# Patient Record
Sex: Female | Born: 1950 | Race: White | Hispanic: No | Smoking: Never smoker
Health system: Southern US, Community
[De-identification: ages and names within clinical notes are randomized; demographics above are authoritative.]

---

## 2006-03-31 ENCOUNTER — Ambulatory Visit: Payer: Self-pay

## 2009-10-22 ENCOUNTER — Inpatient Hospital Stay (HOSPITAL_COMMUNITY): Admission: RE | Admit: 2009-10-22 | Discharge: 2009-10-25 | Payer: Self-pay | Admitting: Orthopedic Surgery

## 2009-11-24 ENCOUNTER — Encounter: Payer: Self-pay | Admitting: Orthopedic Surgery

## 2009-11-28 ENCOUNTER — Encounter: Payer: Self-pay | Admitting: Orthopedic Surgery

## 2009-12-28 ENCOUNTER — Encounter: Payer: Self-pay | Admitting: Orthopedic Surgery

## 2010-08-30 ENCOUNTER — Ambulatory Visit
Admission: RE | Admit: 2010-08-30 | Discharge: 2010-08-30 | Payer: Self-pay | Source: Home / Self Care | Attending: Family Medicine | Admitting: Family Medicine

## 2010-08-30 ENCOUNTER — Encounter: Payer: Self-pay | Admitting: Family Medicine

## 2010-08-30 DIAGNOSIS — E785 Hyperlipidemia, unspecified: Secondary | ICD-10-CM | POA: Insufficient documentation

## 2010-10-01 NOTE — Assessment & Plan Note (Signed)
Summary: SORE THROAT AND COUGH/EVM   Vital Signs:  Patient Profile:   60 Years Old Female CC:      sore throat, cough Height:     66 inches Weight:      184 pounds BMI:     29.81 O2 Sat:      96 % O2 treatment:    Room Air Temp:     97.4 degrees F Pulse rate:   80 / minute BP sitting:   122 / 78  (left arm) Cuff size:   regular  Vitals Entered By: Haze Boyden, CMA (August 30, 2010 11:24 AM)                Lab Results    R. Strep:    Neg    Current Allergies: ! MORPHINE SULFATE (MORPHINE SULFATE) ! CODEINE SULFATE (CODEINE SULFATE) ! VICODIN (HYDROCODONE-ACETAMINOPHEN)History of Present Illness History from: patient Chief Complaint: sore throat, cough, ear pain, ribs hurt  History of Present Illness: Pt presented today because for the last week she has been having worsening symptoms of sinus pressure, pain, postnasal drainage, cough, wheezing, congestion in the chest, thick yellow green mucus production in the chest, ear fullness, sore throat and allergy symptoms.  She says that she has noticed that at night she begins to wheeze and cough more and having some SOB on exertion when she notices wheezing.  No fever, No diarrhea or vomiting.  She says that she is normally healthy.  She says that she is concerned about the progressive symptoms and believes she may have another case of bronchitis which she has had multiple times in the past.    REVIEW OF SYSTEMS Constitutional Symptoms      Denies fever, chills, night sweats, weight loss, weight gain, and fatigue.  Eyes       Denies change in vision, eye pain, eye discharge, glasses, contact lenses, and eye surgery. Ear/Nose/Throat/Mouth       Complains of ear pain, sore throat, and hoarseness.      Denies hearing loss/aids, change in hearing, ear discharge, dizziness, frequent runny nose, frequent nose bleeds, sinus problems, and tooth pain or bleeding.  Respiratory       Complains of dry cough, productive cough, and  wheezing.      Denies shortness of breath, asthma, bronchitis, and emphysema/COPD.  Cardiovascular       Denies murmurs, chest pain, and tires easily with exhertion.    Gastrointestinal       Denies stomach pain, nausea/vomiting, diarrhea, constipation, blood in bowel movements, and indigestion. Genitourniary       Denies painful urination, kidney stones, and loss of urinary control. Neurological       Denies paralysis, seizures, and fainting/blackouts. Musculoskeletal       Denies muscle pain, joint pain, joint stiffness, decreased range of motion, redness, swelling, muscle weakness, and gout.  Skin       Denies bruising, unusual mles/lumps or sores, and hair/skin or nail changes.  Psych       Denies mood changes, temper/anger issues, anxiety/stress, speech problems, depression, and sleep problems.  Past History:  Family History: Last updated: 08/30/2010 Mother- Diabetic type 2 father- diabetic type 1 1 brother- healthy 2 sisters- healthy  Social History: Last updated: 08/30/2010 Married Never Smoked Alcohol use-no Drug use-no Regular exercise-yes Occupation: Currently Unemployed Has horses at home and cares for them.  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 Smoking Status:  never Drug Use:  no Does Patient Exercise:  yes Physical Exam General appearance: well developed, well nourished, no acute distress Head: normocephalic, atraumatic Eyes: conjunctivae and lids normal Pupils: equal, round,  reactive to light Ears: normal, no lesions or deformities Nasal: pale, boggy, swollen nasal turbinates Oral/Pharynx: tongue normal, posterior pharynx with mild erythema, no exudate, small ulcer on uvula Neck: neck supple,  trachea midline, no masses Chest/Lungs: rare fine exp wheezes bilateral heard anteriorly, no rhonchi, bilateral breath sounds equal without effort Heart: regular rate and  rhythm, no murmur Abdomen: soft, non-tender without obvious organomegaly Extremities: normal extremities Neurological: grossly intact and non-focal Skin: no obvious rashes or lesions MSE: oriented to time, place, and person Assessment Additional workup planned New Problems: ACUTE PHARYNGITIS (ICD-462) ACUTE BRONCHITIS (ICD-466.0) ACUTE SINUSITIS, UNSPECIFIED (ICD-461.9) ALLERGIC RHINITIS CAUSE UNSPECIFIED (ICD-477.9) HYPERLIPIDEMIA (ICD-272.4)   Patient Education: Patient and/or caregiver instructed in the following: rest, Ibuprofen prn. The risks, benefits and possible side effects were clearly explained and discussed with the patient.  The patient verbalized clear understanding.  The patient was given instructions to return if symptoms don't improve, worsen or new changes develop.  If it is not during clinic hours and the patient cannot get back to this clinic then the patient was told to seek medical care at an available urgent care or emergency department.  The patient verbalized understanding.   Demonstrates willingness to comply.  Plan New Medications/Changes: IBUPROFEN 600 MG TABS (IBUPROFEN) take 1 by mouth every 8 hours with food as needed for sore throat, body aches  #9 x 0, 08/30/2010, Mitzy Naron MD VENTOLIN HFA 108 (90 BASE) MCG/ACT AERS (ALBUTEROL SULFATE) 2 puffs every 4 hours as needed cough, wheezing, SOB  #1 x 0, 08/30/2010, Mckennon Zwart MD FLUTICASONE PROPIONATE 50 MCG/ACT SUSP (FLUTICASONE PROPIONATE) take 2 sprays per nostril once daily  #1 x 0, 08/30/2010, Ling Flesch MD CETIRIZINE HCL 10 MG TABS (CETIRIZINE HCL) take 1 by mouth daily for allergies  #20 x 0, 08/30/2010, Ryna Beckstrom MD AZITHROMYCIN 250 MG TABS (AZITHROMYCIN) take 2 tabs by mouth on day 1, then 1 tab by mouth daily for 4 days  #6 x 0, 08/30/2010, Standley Dakins MD  New Orders: Rapid Strep [[id number]] Follow Up: Follow up in 2-3 days if no improvement, Follow up on an as needed basis, Follow up with Primary Physician  The patient and/or caregiver has been counseled thoroughly with regard to medications prescribed including dosage, schedule, interactions, rationale for use, and possible side effects and they verbalize understanding.  Diagnoses and expected course of recovery discussed and will return if not improved as expected or if the condition worsens. Patient and/or caregiver verbalized understanding.  Prescriptions: IBUPROFEN 600 MG TABS (IBUPROFEN) take 1 by mouth every 8 hours with food as needed for sore throat, body aches  #9 x 0   Entered and Authorized by:   Standley Dakins MD   Signed by:   Standley Dakins MD on 08/30/2010   Method used:   Electronically to        [facility] [address] (retail)       [address], [address]  [place], [place]  [address]       Ph: [phone]       Fax: [phone]   RxID:   [id number] VENTOLIN HFA 108 (90 BASE) MCG/ACT AERS (ALBUTEROL SULFATE) 2 puffs every 4 hours as needed cough, wheezing, SOB  #1 x 0   Entered and Authorized by:   Standley Dakins MD   Signed by:   Standley Dakins MD on 08/30/2010   Method used:   Electronically to        [facility] [address] (retail)       [address], [address]       [place], [place]  [address]       Ph: [phone]       Fax: [phone]   RxID:   [id number] FLUTICASONE PROPIONATE 50 MCG/ACT SUSP (FLUTICASONE PROPIONATE) take 2 sprays per nostril once daily  #1 x 0   Entered and Authorized by:   Standley Dakins MD   Signed by:   Standley Dakins MD on 08/30/2010   Method used:   Electronically to        [facility] [address] (retail)       [address], [address]       [place], [place]  [address]       Ph: [phone]       Fax: [phone]   RxID:   [id number] CETIRIZINE HCL 10 MG TABS (CETIRIZINE HCL) take 1 by mouth daily for allergies  #20 x 0   Entered and Authorized by:   Standley Dakins MD   Signed by:   Standley Dakins MD on 08/30/2010   Method used:   Electronically to        [facility] [address] (retail)       [address], [address]       [place], [place]  [address]       Ph: [phone]       Fax: [phone]   RxID:   [id number] AZITHROMYCIN 250 MG TABS (AZITHROMYCIN) take 2 tabs by mouth on day 1, then 1 tab by mouth daily for 4 days  #6 x 0   Entered and Authorized by:   Standley Dakins MD   Signed by:   Standley Dakins MD on 08/30/2010   Method used:   Electronically to        [facility] [address] (retail)       [address], [address]       [place], [place]  [address]       Ph: [phone]       Fax: [phone]   RxID:   [id number]   Patient Instructions: 1)  Take 600mg  of Ibuprofen (Advil, Motrin) with food every 8 hours as needed for relief of pain or comfort of fever. 2)  Take your antibiotic as prescribed until ALL of it is gone, but stop if you develop a rash or swelling and contact our office as soon as possible. 3)  Acute sinusitis symptoms for less than 10 days are not helped by antibiotics.Use warm moist compresses, and over the counter decongestants ( only as directed). Call if no improvement in 5-7 days, sooner if increasing pain, fever, or new symptoms. 4)  Acute bronchitis symptoms for less than 10 days are not helped by antibiotics. take over the counter cough medications. call if no improvment in  5-7 days, sooner if increasing  cough, fever, or new symptoms( shortness of breath, chest pain). 5)  Return if symptoms worsen or don't improve. 6)  The patient was informed that there is no on-call provider or services available at this clinic during off-hours (when the clinic is closed).  If the patient developed a problem or concern that required immediate attention, the patient was advised to go the the nearest available urgent care or emergency department for medical care.  The patient verbalized understanding.     Orders Added: 1)  Rapid Strep [[id number]]

## 2010-10-06 ENCOUNTER — Ambulatory Visit: Payer: Self-pay | Admitting: Orthopedic Surgery

## 2010-11-19 LAB — BASIC METABOLIC PANEL
CO2: 29 mEq/L (ref 19–32)
CO2: 29 mEq/L (ref 19–32)
Calcium: 8.5 mg/dL (ref 8.4–10.5)
Chloride: 106 mEq/L (ref 96–112)
Creatinine, Ser: 0.72 mg/dL (ref 0.4–1.2)
GFR calc Af Amer: >60 mL/min (ref >60)
GFR calc Af Amer: >60 mL/min (ref >60)
Glucose, Bld: 190 mg/dL — ABNORMAL HIGH (ref 70–99)
Potassium: 3.8 mEq/L (ref 3.5–5.1)
Potassium: 4.7 mEq/L (ref 3.5–5.1)
Sodium: 136 mEq/L (ref 135–145)

## 2010-11-19 LAB — APTT: aPTT: 30 seconds (ref 24–37)

## 2010-11-19 LAB — COMPREHENSIVE METABOLIC PANEL
Albumin: 4.3 g/dL (ref 3.5–5.2)
Alkaline Phosphatase: 62 U/L (ref 39–117)
BUN: 7 mg/dL (ref 6–23)
CO2: 27 mEq/L (ref 19–32)
Chloride: 105 mEq/L (ref 96–112)
Creatinine, Ser: 0.73 mg/dL (ref 0.4–1.2)
GFR calc non Af Amer: >60 mL/min (ref >60)
Glucose, Bld: 93 mg/dL (ref 70–99)
Total Bilirubin: 1 mg/dL (ref 0.3–1.2)

## 2010-11-19 LAB — CBC
HCT: 28.6 % — ABNORMAL LOW (ref 36.0–46.0)
HCT: 29.6 % — ABNORMAL LOW (ref 36.0–46.0)
HCT: 31 % — ABNORMAL LOW (ref 36.0–46.0)
HCT: 42.9 % (ref 36.0–46.0)
Hemoglobin: 10.8 g/dL — ABNORMAL LOW (ref 12.0–15.0)
Hemoglobin: 14.8 g/dL (ref 12.0–15.0)
Hemoglobin: 9.5 g/dL — ABNORMAL LOW (ref 12.0–15.0)
MCHC: 33.1 g/dL (ref 30.0–36.0)
MCHC: 34.8 g/dL (ref 30.0–36.0)
MCHC: 34.9 g/dL (ref 30.0–36.0)
MCV: 90 fL (ref 78.0–100.0)
MCV: 90.7 fL (ref 78.0–100.0)
MCV: 91 fL (ref 78.0–100.0)
Platelets: 250 10*3/uL (ref 150–400)
RBC: 3.18 MIL/uL — ABNORMAL LOW (ref 3.87–5.11)
RBC: 3.25 MIL/uL — ABNORMAL LOW (ref 3.87–5.11)
RBC: 3.44 MIL/uL — ABNORMAL LOW (ref 3.87–5.11)
RDW: 11.9 % (ref 11.5–15.5)
RDW: 11.9 % (ref 11.5–15.5)
WBC: 4.4 10*3/uL (ref 4.0–10.5)
WBC: 7.6 10*3/uL (ref 4.0–10.5)

## 2010-11-19 LAB — URINALYSIS, ROUTINE W REFLEX MICROSCOPIC
Bilirubin Urine: NEGATIVE
Hgb urine dipstick: NEGATIVE
Ketones, ur: NEGATIVE mg/dL
Nitrite: NEGATIVE
Protein, ur: NEGATIVE mg/dL
Urobilinogen, UA: 0.2 mg/dL (ref 0.0–1.0)

## 2010-11-19 LAB — PROTIME-INR
INR: 0.99 (ref 0.00–1.49)
INR: 1.1 (ref 0.00–1.49)
Prothrombin Time: 13 seconds (ref 11.6–15.2)

## 2012-11-01 ENCOUNTER — Ambulatory Visit: Payer: Self-pay | Admitting: Family Medicine

## 2013-03-27 ENCOUNTER — Ambulatory Visit: Payer: Self-pay | Admitting: Family Medicine

## 2014-04-24 ENCOUNTER — Ambulatory Visit: Payer: Self-pay | Admitting: Family Medicine

## 2015-04-10 ENCOUNTER — Other Ambulatory Visit: Payer: Self-pay | Admitting: Family Medicine

## 2015-04-10 DIAGNOSIS — Z1231 Encounter for screening mammogram for malignant neoplasm of breast: Secondary | ICD-10-CM

## 2015-04-28 ENCOUNTER — Ambulatory Visit
Admission: RE | Admit: 2015-04-28 | Discharge: 2015-04-28 | Disposition: A | Discharge status: Home / Self Care | Payer: Commercial Managed Care - HMO | Source: Ambulatory Visit | Attending: Family Medicine | Admitting: Family Medicine

## 2015-04-28 DIAGNOSIS — Z1231 Encounter for screening mammogram for malignant neoplasm of breast: Secondary | ICD-10-CM | POA: Diagnosis not present

## 2015-07-10 ENCOUNTER — Other Ambulatory Visit: Payer: Self-pay | Admitting: Family Medicine

## 2015-07-10 DIAGNOSIS — M546 Pain in thoracic spine: Secondary | ICD-10-CM

## 2015-07-21 ENCOUNTER — Ambulatory Visit
Admission: RE | Admit: 2015-07-21 | Discharge: 2015-07-21 | Disposition: A | Discharge status: Home / Self Care | Payer: Commercial Managed Care - HMO | Source: Ambulatory Visit | Attending: Family Medicine | Admitting: Family Medicine

## 2015-07-21 DIAGNOSIS — M546 Pain in thoracic spine: Secondary | ICD-10-CM | POA: Diagnosis present

## 2015-07-21 DIAGNOSIS — K449 Diaphragmatic hernia without obstruction or gangrene: Secondary | ICD-10-CM | POA: Diagnosis not present

## 2015-07-21 DIAGNOSIS — M2578 Osteophyte, vertebrae: Secondary | ICD-10-CM | POA: Insufficient documentation

## 2015-07-21 LAB — POCT I-STAT CREATININE: Creatinine, Ser: 0.8 mg/dL (ref 0.44–1.00)

## 2015-07-21 MED ORDER — IOHEXOL 300 MG/ML  SOLN
75.0000 mL | Freq: Once | INTRAMUSCULAR | Status: AC | PRN
  Administered 2015-07-21: 75 mL via INTRAVENOUS

## 2016-03-19 ENCOUNTER — Other Ambulatory Visit: Payer: Self-pay | Admitting: Family Medicine

## 2016-03-19 DIAGNOSIS — Z1231 Encounter for screening mammogram for malignant neoplasm of breast: Secondary | ICD-10-CM

## 2016-04-28 ENCOUNTER — Ambulatory Visit
Admission: RE | Admit: 2016-04-28 | Discharge: 2016-04-28 | Disposition: A | Discharge status: Home / Self Care | Payer: Commercial Managed Care - HMO | Source: Ambulatory Visit | Attending: Family Medicine | Admitting: Family Medicine

## 2016-04-28 ENCOUNTER — Other Ambulatory Visit: Payer: Self-pay | Admitting: Family Medicine

## 2016-04-28 DIAGNOSIS — Z1231 Encounter for screening mammogram for malignant neoplasm of breast: Secondary | ICD-10-CM | POA: Insufficient documentation

## 2016-05-26 ENCOUNTER — Ambulatory Visit: Payer: Commercial Managed Care - HMO | Attending: Family Medicine | Admitting: Physical Therapy

## 2016-05-26 DIAGNOSIS — M542 Cervicalgia: Secondary | ICD-10-CM

## 2016-05-26 DIAGNOSIS — M25511 Pain in right shoulder: Secondary | ICD-10-CM | POA: Diagnosis present

## 2016-05-26 DIAGNOSIS — R293 Abnormal posture: Secondary | ICD-10-CM | POA: Diagnosis present

## 2016-05-26 NOTE — Patient Instructions (Signed)
[email or web address]  Grip and pinch grip with putty Cervical rotation with towel  Cervical retraction
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
## 2016-05-31 ENCOUNTER — Ambulatory Visit: Payer: Medicare Other | Attending: Family Medicine

## 2016-05-31 DIAGNOSIS — R293 Abnormal posture: Secondary | ICD-10-CM | POA: Diagnosis present

## 2016-05-31 DIAGNOSIS — M25511 Pain in right shoulder: Secondary | ICD-10-CM

## 2016-05-31 DIAGNOSIS — G8929 Other chronic pain: Secondary | ICD-10-CM | POA: Diagnosis present

## 2016-05-31 DIAGNOSIS — M542 Cervicalgia: Secondary | ICD-10-CM

## 2016-05-31 NOTE — Therapy (Signed)
[place] [facility]ICINE [address] [place], [address] Phone: [phone]   Fax:  [phone]  Physical Therapy Treatment  Patient Details  Name: Gina Gould MRN: [id number] Date of Birth: 29-Oct-1950 Referring Provider: Burnadette Pop  Encounter Date: 05/31/2016      PT End of Session - 05/31/16 0939    Visit Number 2   Number of Visits 13   Date for PT Re-Evaluation 06/23/16   PT Start Time 0936   PT Stop Time 1020   PT Time Calculation (min) 44 min   Activity Tolerance Patient tolerated treatment well   Behavior During Therapy [facility] for tasks assessed/performed      No past medical history on file.  No past surgical history on file.  There were no vitals filed for this visit.      Subjective Assessment - 05/31/16 0936    Subjective Pt reports she has been compliant with HEP. She states she was very sore following last session, which she reports as muscle soreness. She states she was able to ride 2 horses over the weekend (about an hour each) and had R arm soreness/achiness following which subsided by the next day.   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 Yes   Pain Score 2    Pain Location Neck   Pain Orientation Right   Pain Descriptors / Indicators Aching   Pain Onset More than a month ago   Multiple Pain Sites Yes   Pain Score 3   Pain Location Arm   Pain Orientation Right;Posterior   Pain Descriptors / Indicators Aching   Pain Onset More than a month ago     Spurling's: neg Distraction: neg  Grade 1 cervical mobs, C1-C7; hypomobile throughout, c/o tenderness at C2-C5, no change in RUE symptoms  Grade 2-3 thoracic mobs, T1-T6 x 30 sec at each segment; c/o tenderness especially at T4; c/o symptoms radiating ("achiness") down right side with mobs at T4, no change with prolonged  mobilization; no change in RUE symptoms  Manual cervical distraction x 8 mins; pt reported relief with this, no change in RUE symptoms  Assessed UT, levator scap, and scalene muscle length bilaterally; pt had increased pain with R levator scap and no pain with anything else  Manual levator scap stretch, 3x20-30 sec each; pt reported decreased pain with subsequent stretching  Cervical rotation after manual distraction: 40 deg L, 39 deg R (pain around SCM insertion and at L medial scapula musculature)  Cross friction and muscle stripping to R SCM x 5 mins; pt had slight increase in AROM following; 45 deg R following, still c/o pain at [facility] insertion at end range      PT Education - 05/31/16 1039    Education provided Yes   Education Details continue HEP, may request imaging if RUE weakness is not improved after next few sessions   Person(s) Educated Patient   Methods Explanation   Comprehension Verbalized understanding             PT Long Term Goals - 05/26/16 1230      PT LONG TERM GOAL #1   Title 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   Time 6   Period Weeks   Status New     PT LONG  TERM GOAL #2   Title Pt will be able to ride her motorcycle for >250 miles at a time to demontrate improved overall function and decreased pain.   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   Baseline unable to lift 15# due to pain   Time 6   Period Weeks   Status New               Plan - 05/31/16 1039    Clinical Impression Statement Pt presents to therapy with continued RUE weakness and c/o achiness down posterior arm. Pt reported being able to ride horses over the weekend for short periods of time with slight increase  in pain down RUE which subsided after rest. Pt educated that PT may request MRI to r/o cervical nerve compression if her RUE weakness does not improved within the next few sessions. Pt tolerated manual treatment well and demonstrated some improvedments in cervical ROM following. Pt needs continued skilled PT intervention to maximize overall function.   Rehab Potential Good   Clinical Impairments Affecting Rehab Potential motivated, active   PT Frequency 2x / week   PT Duration 6 weeks   PT Treatment/Interventions ADLs/Self Care Home Management;Cryotherapy;Electrical Stimulation;Iontophoresis 4mg /ml Dexamethasone;Moist Heat;Ultrasound;Functional mobility training;Therapeutic activities;Therapeutic exercise;Neuromuscular re-education;Patient/family education;Manual techniques;Passive range of motion;Dry needling;Taping   PT Next Visit Plan manual, AROM, posture, strengthening   Consulted and Agree with Plan of Care Patient      Patient will benefit from skilled therapeutic intervention in order to improve the following deficits and impairments:  Decreased mobility, Decreased range of motion, Decreased strength, Hypomobility, Increased muscle spasms, Impaired UE functional use, Improper body mechanics, Postural dysfunction, Pain  Visit Diagnosis: Cervicalgia  Abnormal posture  Acute pain of right shoulder     Problem List Patient Active Problem List   Diagnosis Date Noted  . HYPERLIPIDEMIA 08/30/2010    This entire session was performed under direct supervision and direction of a licensed therapist/therapist assistant . I have personally read, edited and approve of the note as written.   Jac CanavanBrooke Salbador Fiveash, SPT Lynnea MaizesJason D Huprich PT, DPT   Huprich,Jason 05/31/2016, 4:40 PM   [facility] [address] [place], [place], [address] Phone: [phone]   Fax:  [phone]  Name: Gwinda MaineJanet C Wyszynski MRN: [id number] Date of Birth:  [birth year]

## 2016-06-02 ENCOUNTER — Ambulatory Visit: Payer: Medicare Other

## 2016-06-02 DIAGNOSIS — M542 Cervicalgia: Secondary | ICD-10-CM | POA: Diagnosis not present

## 2016-06-02 DIAGNOSIS — M25511 Pain in right shoulder: Secondary | ICD-10-CM

## 2016-06-02 DIAGNOSIS — R293 Abnormal posture: Secondary | ICD-10-CM

## 2016-06-02 NOTE — Therapy (Signed)
[place] [facility] [address] [place], [place], [address] Phone: [phone]   Fax:  [phone]  Physical Therapy Treatment  Patient Details  Name: Gina Gould MRN: [id number] Date of Birth: 02/27/1951 Referring Provider: Burnadette PopLinthavong  Encounter Date: 06/02/2016      PT End of Session - 06/02/16 0931    Visit Number 3   Number of Visits 13   Date for PT Re-Evaluation 06/23/16   PT Start Time 0930   PT Stop Time 1015   PT Time Calculation (min) 45 min   Activity Tolerance Patient tolerated treatment well   Behavior During Therapy [facility] for tasks assessed/performed      History reviewed. No pertinent past medical history.  History reviewed. No pertinent surgical history.  There were no vitals filed for this visit.      Subjective Assessment - 06/02/16 0930    Subjective Pt states that her neck has not been painful, just a little sore. She states that she has had increased soreness under her L shoulder blade.    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 No/denies   Pain Score --  "stiffness"   Pain Onset More than a month ago   Pain Onset More than a month ago     Spurlings: neg bil  Manual: Medial and radial nerve tension tests positive, recreated pt's symptoms of pain down posterior RUE  Performed medial and radial nerve gliding 2 x 10-15 reps each; reassessed neural tension tests and able to take pt further into ROM before onset of pain and pt reported the pain as less intense following nerve gliding  Trigger pint ischemic release and cross friction to L thoracic erector spinae, rhomboid/middle traps x 10 mins   Grade 2 CPAs, C5-T4; c/o tenderness at CPA, no radicular symptoms down RUE; at T4, pt had mild radicular symptoms towards R scapula which subsided following manual  Cervical  rotation: 49 deg R and L; improved from last session and with decreased pain, only had slight pull on R with L rotation  Therex: Bil scap retraction with RTB x 12; added to HEP  Self soft tissue mobilization with tennis ball to L scapular musculature x 2 mins; added to HEP      PT Education - 06/02/16 1200    Education provided Yes   Education Details continue HEP, add bil scap retraction with RTB and self soft tissue mobilization to L scapular muscles with tennis ball   Person(s) Educated Patient   Methods Explanation   Comprehension Verbalized understanding             PT Long Term Goals - 05/26/16 1230      PT LONG TERM GOAL #1   Title 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   Time 6   Period Weeks   Status New     PT LONG TERM GOAL #2   Title Pt will be able to ride her motorcycle for >250 miles at a time to demontrate improved overall function and decreased pain.   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   Baseline unable to lift 15# due to pain   Time 6   Period Weeks   Status New               Plan - 06/02/16 1201    Clinical Impression Statement Pt making progress towards goals as demo by increased cervical AROM with decreased pain with AROM. Pt had positive neural tension tests of medial and radial nerve as both recreated her symptoms down posterior RUE. Following nerve gliding, pt had decreased intensity and increased range during neural tension tests. Pt needs continued skilled PT intervention to maximize overall function.   Rehab Potential Good   Clinical Impairments Affecting Rehab Potential motivated, active   PT Frequency 2x / week   PT Duration 6 weeks   PT Treatment/Interventions  ADLs/Self Care Home Management;Cryotherapy;Electrical Stimulation;Iontophoresis 4mg /ml Dexamethasone;Moist Heat;Ultrasound;Functional mobility training;Therapeutic activities;Therapeutic exercise;Neuromuscular re-education;Patient/family education;Manual techniques;Passive range of motion;Dry needling;Taping   PT Next Visit Plan manual, AROM, posture, strengthening   Consulted and Agree with Plan of Care Patient      Patient will benefit from skilled therapeutic intervention in order to improve the following deficits and impairments:  Decreased mobility, Decreased range of motion, Decreased strength, Hypomobility, Increased muscle spasms, Impaired UE functional use, Improper body mechanics, Postural dysfunction, Pain  Visit Diagnosis: Cervicalgia  Abnormal posture  Acute pain of right shoulder     Problem List Patient Active Problem List   Diagnosis Date Noted  . HYPERLIPIDEMIA 08/30/2010   This entire session was performed under direct supervision and direction of a licensed therapist/therapist assistant . I have personally read, edited and approve of the note as written.   Gina Gould, SPT Gina Gould PT, DPT   Gina Gould,Gina Gould 06/03/2016, 10:49 AM  [place] [facility] PHYSICAL AND SPORTS MEDICINE [address] [place], [address] Phone: [phone]   Fax:  [phone]  Name: Gina Gould MRN: [id number] Date of Birth: 05-19-1951

## 2016-06-08 ENCOUNTER — Ambulatory Visit: Payer: Medicare Other | Admitting: Physical Therapy

## 2016-06-08 DIAGNOSIS — R293 Abnormal posture: Secondary | ICD-10-CM

## 2016-06-08 DIAGNOSIS — M25511 Pain in right shoulder: Secondary | ICD-10-CM

## 2016-06-08 DIAGNOSIS — M542 Cervicalgia: Secondary | ICD-10-CM

## 2016-06-08 NOTE — Therapy (Signed)
[place] [facility] [address] [place], [place], [address] Phone: [phone]   Fax:  [phone]  Physical Therapy Treatment  Patient Details  Name: Gina Gould MRN: [id number] Date of Birth: [birth year] Referring Provider: Burnadette PopLinthavong  Encounter Date: 06/08/2016      PT End of Session - 06/08/16 0952    Visit Number 4   Number of Visits 13   Date for PT Re-Evaluation 06/23/16   PT Start Time 0948   PT Stop Time 1030   PT Time Calculation (min) 42 min   Activity Tolerance Patient tolerated treatment well   Behavior During Therapy [facility] for tasks assessed/performed      No past medical history on file.  No past surgical history on file.  There were no vitals filed for this visit.      Subjective Assessment - 06/08/16 0950    Subjective Pt reports "I feel like my neck is moving better." She also reports that her RUE has not been as painful since last treatment session either.   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 Yes   Pain Score 1    Pain Location Neck   Pain Orientation Right   Pain Descriptors / Indicators Tightness   Pain Onset More than a month ago   Pain Onset More than a month ago     Manual: Median and Radial Nerve tension testing: pt able to go further into ROM this date compared to initial testing last week; radial nerve improved significantly, median nerve still produced symptoms before full range  Median nerve glides 2x15 with head turns, radial nerve glides x 15 with head turns; able to get pt slightly further into median nerve glide test following  Grade 3 CPAs C7 - T8, 2x30-45 sec bouts each; pt reported increased tenderness at T4 with slight radiation towards R shoulder blade; hypomobile throughout, though increased hypomobility at C7-T1  Scap retraction with ER with RTB,  2x15; pt reported fatigue    Shoulder AROM: R ER T5, non-painful L ER T5  R IR T8, pain at end range L IR T5  Grip strength: 62# LUE 58# RUE Pinch grip strength: 11# LUE 8# RUE   Cervical Rotation AROM: R 62 deg, slight pull at end range L 70 deg, no pain       PT Education - 06/08/16 1126    Education provided Yes   Education Details continue HEP, add R shouler IR stretch   Person(s) Educated Patient   Methods Explanation   Comprehension Verbalized understanding             PT Long Term Goals - 05/26/16 1230      PT LONG TERM GOAL #1   Title 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   Time 6   Period Weeks   Status New     PT LONG TERM GOAL #2   Title Pt will be able to ride her motorcycle for >250 miles at a time to demontrate improved overall function and decreased pain.   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   Baseline unable to lift 15# due to pain   Time 6   Period Weeks   Status New               Plan - 06/08/16 1126    Clinical Impression Statement Pt has made significant progress as noted by improved cervical and RUE ROM, RUE strength and subjective reports of improved symptoms overall. Pt grip strength has improved significantly, but her RUE myotomes are still decreased throughout. Pt continues with increased hypomobility of throacic and lower cervical spine and with soft tissue restrictions. Pt's postural deficits are still present but are progressing. Pt needs continued skilled PT intervention to maximize overall function, strength and posture.    Rehab Potential Good   Clinical Impairments Affecting Rehab Potential motivated, active   PT Frequency 2x / week   PT Duration 6  weeks   PT Treatment/Interventions ADLs/Self Care Home Management;Cryotherapy;Electrical Stimulation;Iontophoresis 4mg /ml Dexamethasone;Moist Heat;Ultrasound;Functional mobility training;Therapeutic activities;Therapeutic exercise;Neuromuscular re-education;Patient/family education;Manual techniques;Passive range of motion;Dry needling;Taping   PT Next Visit Plan manual, AROM, posture, strengthening   Consulted and Agree with Plan of Care Patient      Patient will benefit from skilled therapeutic intervention in order to improve the following deficits and impairments:  Decreased mobility, Decreased range of motion, Decreased strength, Hypomobility, Increased muscle spasms, Impaired UE functional use, Improper body mechanics, Postural dysfunction, Pain  Visit Diagnosis: Cervicalgia  Abnormal posture  Acute pain of right shoulder     Problem List Patient Active Problem List   Diagnosis Date Noted  . HYPERLIPIDEMIA 08/30/2010   Jac Canavan, SPT  Jac Canavan 06/08/2016, 11:33 AM  [facility] PHYSICAL AND SPORTS MEDICINE [address], [place], [address] Phone: [phone]   Fax:  [phone]  Name: Gina Gould MRN: [id number] Date of Birth: 1950/12/20

## 2016-06-10 ENCOUNTER — Ambulatory Visit: Payer: Medicare Other | Admitting: Physical Therapy

## 2016-06-10 DIAGNOSIS — M542 Cervicalgia: Secondary | ICD-10-CM | POA: Diagnosis not present

## 2016-06-10 DIAGNOSIS — M25511 Pain in right shoulder: Secondary | ICD-10-CM

## 2016-06-10 DIAGNOSIS — R293 Abnormal posture: Secondary | ICD-10-CM

## 2016-06-10 NOTE — Therapy (Signed)
[facility] [address] [place], [place], [address] Phone: [phone]   Fax:  [phone]  Physical Therapy Treatment  Patient Details  Name: Gwinda MaineJanet C Every MRN: [id number] Date of Birth: 03/22/1951 Referring Provider: Burnadette PopLinthavong  Encounter Date: 06/10/2016      PT End of Session - 06/10/16 1303    Visit Number 5   Number of Visits 13   Date for PT Re-Evaluation 06/23/16   PT Start Time 1300   PT Stop Time 1338   PT Time Calculation (min) 38 min   Activity Tolerance Patient tolerated treatment well   Behavior During Therapy [facility] for tasks assessed/performed      No past medical history on file.  No past surgical history on file.  There were no vitals filed for this visit.      Subjective Assessment - 06/10/16 1302    Subjective Pt reports she has not had anymore RUE pain, that she is a little stiff in her neck and still has slight pulling in her shoulder blade.   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 No/denies   Pain Onset More than a month ago   Pain Onset More than a month ago      MANUAL: Grade 3 CPAs T1-T8; 4 bouts x 30-45 sec at T1 as pt reported that as the stiffest area; 2 bouts x 30-45 sec at all other levels; pt reported feeling looser following  Cross friction and trigger point ischemic release to L thoracic erector spinae x 12 mins  Thoracic extension over foam roll x 3 mins; min cues for proper technique   Added seated thoracic rotations and thoracic extension over foam roll to HEP       PT Education - 06/10/16 1342    Education provided Yes   Education Details continue HEP, add thoracic rotations and thoracic extension over foam roll to HEP; take next few weeks off and return on 10/24   Person(s) Educated Patient   Methods Explanation   Comprehension Verbalized  understanding             PT Long Term Goals - 05/26/16 1230      PT LONG TERM GOAL #1   Title 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   Time 6   Period Weeks   Status New     PT LONG TERM GOAL #2   Title Pt will be able to ride her motorcycle for >250 miles at a time to demontrate improved overall function and decreased pain.   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   Baseline unable to lift 15# due to pain   Time 6   Period Weeks   Status New               Plan - 06/10/16 1343    Clinical Impression Statement Pt has made significant progress as she reports no RUE radicular symptoms, no pain with neck movement, and only minor stiffness in neck. Pt is not limited  in her daily activities anymore and she appears to be at baseline. Pt will take the next few weeks off and focus on HEP for maintenance of progress and will return to therapy on 10/24. 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   Rehab Potential Good   Clinical Impairments Affecting Rehab Potential motivated, active   PT Frequency 2x / week   PT Duration 6 weeks   PT Treatment/Interventions ADLs/Self Care Home Management;Cryotherapy;Electrical Stimulation;Iontophoresis 4mg /ml Dexamethasone;Moist Heat;Ultrasound;Functional mobility training;Therapeutic activities;Therapeutic exercise;Neuromuscular re-education;Patient/family education;Manual techniques;Passive range of motion;Dry needling;Taping   PT Next Visit Plan manual, AROM, posture, strengthening   Consulted and Agree with Plan of Care Patient      Patient will benefit from skilled therapeutic  intervention in order to improve the following deficits and impairments:  Decreased mobility, Decreased range of motion, Decreased strength, Hypomobility, Increased muscle spasms, Impaired UE functional use, Improper body mechanics, Postural dysfunction, Pain  Visit Diagnosis: Cervicalgia  Abnormal posture  Acute pain of right shoulder     Problem List Patient Active Problem List   Diagnosis Date Noted  . HYPERLIPIDEMIA 08/30/2010   Jac Canavan, SPT  Jac Canavan 06/10/2016, 1:54 PM  [place] [facility] PHYSICAL AND SPORTS MEDICINE [address], [place], [address] Phone: [phone]   Fax:  [phone]  Name: DIONISIA PACHOLSKI MRN: [id number] Date of Birth: May 13, 1951

## 2016-06-15 ENCOUNTER — Encounter: Payer: Commercial Managed Care - HMO | Admitting: Physical Therapy

## 2016-06-17 ENCOUNTER — Encounter: Payer: Commercial Managed Care - HMO | Admitting: Physical Therapy

## 2016-06-22 ENCOUNTER — Ambulatory Visit: Payer: Medicare Other | Admitting: Physical Therapy

## 2016-06-22 DIAGNOSIS — M542 Cervicalgia: Secondary | ICD-10-CM

## 2016-06-22 DIAGNOSIS — R293 Abnormal posture: Secondary | ICD-10-CM

## 2016-06-22 DIAGNOSIS — G8929 Other chronic pain: Secondary | ICD-10-CM

## 2016-06-22 DIAGNOSIS — M25511 Pain in right shoulder: Secondary | ICD-10-CM

## 2016-06-22 NOTE — Patient Instructions (Addendum)
Self levator scap stretch on R with OP, 3x30-45 sec bouts each  ULTT - 2 stretching x 8 for 3 sets with 5" holds  On RUE  Towel cervical rotation stretch x 30" x 6 repetitions   Patient reports some soreness in posterior arm, provided cross body stretch x8 for 15" holds

## 2016-06-22 NOTE — Therapy (Signed)
[facility] PHYSICAL AND SPORTS MEDICINE [address] [place], [place], [address] Phone: [phone]   Fax:  [phone]  Physical Therapy Treatment  Patient Details  Name: Gina Gould MRN: [id number] Date of Birth: 25-Apr-1951 Referring Provider: Burnadette PopLinthavong  Encounter Date: 06/22/2016      PT End of Session - 06/22/16 0956    Visit Number 6   Number of Visits 13   Date for PT Re-Evaluation 06/23/16   PT Start Time 0945   PT Stop Time 1027   PT Time Calculation (min) 42 min   Activity Tolerance Patient tolerated treatment well   Behavior During Therapy [facility] for tasks assessed/performed      No past medical history on file.  No past surgical history on file.  There were no vitals filed for this visit.      Subjective Assessment - 06/22/16 0949    Subjective Pt states she has a little twinge on the R side of her neck and slight pain down the back of her R arm. She put Biofreeze on her R neck which helped with the pain.    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 Yes   Pain Score 1    Pain Location Arm   Pain Orientation Right;Posterior   Pain Descriptors / Indicators Sore   Pain Onset More than a month ago   Pain Onset More than a month ago      Self levator scap stretch on R with OP, 3x30-45 sec bouts each  ULTT - 2 (median nerve) stretching x 8 for 3 sets with 5" holds  On RUE  Towel cervical rotation stretch x 30" x 6 repetitions   Patient reports some soreness in posterior arm, provided cross body stretch x8 for 15" holds                            PT Education - 06/22/16 1012    Education provided Yes   Education Details Call therapist next week to update on status   Person(s) Educated Patient   Methods Explanation;Handout;Demonstration   Comprehension Verbalized  understanding;Returned demonstration             PT Long Term Goals - 05/26/16 1230      PT LONG TERM GOAL #1   Title 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   Time 6   Period Weeks   Status New     PT LONG TERM GOAL #2   Title Pt will be able to ride her motorcycle for >250 miles at a time to demontrate improved overall function and decreased pain.   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   Baseline unable to lift 15# due to pain   Time 6   Period Weeks   Status New               Plan - 06/22/16 1013    Clinical Impression Statement Patient reports she had a set back  over the weekend, though appears to be minor as she has increased her activity level. She reports mild soreness in RUE in this session, which reduced with cross-body stretch and cervical rotation stretching. Patient has made excelllent progress thus far with therapy, and will call therapist in a week to discuss the need of any future treatment sessions.    Rehab Potential Good   Clinical Impairments Affecting Rehab Potential motivated, active   PT Frequency 2x / week   PT Duration 6 weeks   PT Treatment/Interventions ADLs/Self Care Home Management;Cryotherapy;Electrical Stimulation;Iontophoresis 4mg /ml Dexamethasone;Moist Heat;Ultrasound;Functional mobility training;Therapeutic activities;Therapeutic exercise;Neuromuscular re-education;Patient/family education;Manual techniques;Passive range of motion;Dry needling;Taping   PT Next Visit Plan manual, AROM, posture, strengthening   Consulted and Agree with Plan of Care Patient      Patient will benefit from skilled therapeutic intervention in order to improve the following deficits and  impairments:  Decreased mobility, Decreased range of motion, Decreased strength, Hypomobility, Increased muscle spasms, Impaired UE functional use, Improper body mechanics, Postural dysfunction, Pain  Visit Diagnosis: Cervicalgia  Abnormal posture  Chronic right shoulder pain       G-Codes - 17-Jul-2016 1013    Functional Assessment Tool Used Grip strength, patient report   Functional Limitation Carrying, moving and handling objects   Carrying, Moving and Handling Objects Current Status (Z6109) At least 1 percent but less than 20 percent impaired, limited or restricted   Carrying, Moving and Handling Objects Goal Status (U0454) At least 1 percent but less than 20 percent impaired, limited or restricted      Problem List Patient Active Problem List   Diagnosis Date Noted  . HYPERLIPIDEMIA 08/30/2010    Kerin Ransom, PT, DPT    07/17/2016, 10:41 AM  [facility] PHYSICAL AND SPORTS MEDICINE [address] [place], [address] Phone: [phone]   Fax:  [phone]  Name: Gina Gould MRN: [id number] Date of Birth: 24-Dec-1950

## 2016-06-24 ENCOUNTER — Encounter: Payer: Commercial Managed Care - HMO | Admitting: Physical Therapy

## 2016-06-29 ENCOUNTER — Ambulatory Visit: Payer: Medicare Other | Admitting: Physical Therapy

## 2016-07-01 ENCOUNTER — Encounter: Payer: Commercial Managed Care - HMO | Admitting: Physical Therapy

## 2016-12-20 ENCOUNTER — Other Ambulatory Visit: Payer: Self-pay | Admitting: Otolaryngology

## 2016-12-20 DIAGNOSIS — H9311 Tinnitus, right ear: Secondary | ICD-10-CM

## 2016-12-21 ENCOUNTER — Other Ambulatory Visit: Payer: Self-pay | Admitting: Otolaryngology

## 2016-12-21 DIAGNOSIS — H9041 Sensorineural hearing loss, unilateral, right ear, with unrestricted hearing on the contralateral side: Secondary | ICD-10-CM

## 2016-12-21 DIAGNOSIS — H9311 Tinnitus, right ear: Secondary | ICD-10-CM

## 2016-12-21 DIAGNOSIS — IMO0001 Reserved for inherently not codable concepts without codable children: Secondary | ICD-10-CM

## 2016-12-23 ENCOUNTER — Ambulatory Visit
Admission: RE | Admit: 2016-12-23 | Discharge: 2016-12-23 | Disposition: A | Discharge status: Home / Self Care | Payer: Medicare Other | Source: Ambulatory Visit | Attending: Otolaryngology | Admitting: Otolaryngology

## 2016-12-23 DIAGNOSIS — IMO0001 Reserved for inherently not codable concepts without codable children: Secondary | ICD-10-CM

## 2016-12-23 DIAGNOSIS — H9041 Sensorineural hearing loss, unilateral, right ear, with unrestricted hearing on the contralateral side: Secondary | ICD-10-CM

## 2016-12-23 DIAGNOSIS — G939 Disorder of brain, unspecified: Secondary | ICD-10-CM | POA: Insufficient documentation

## 2016-12-23 DIAGNOSIS — H9311 Tinnitus, right ear: Secondary | ICD-10-CM | POA: Insufficient documentation

## 2016-12-23 LAB — POCT I-STAT CREATININE: CREATININE: 0.8 mg/dL (ref 0.44–1.00)

## 2016-12-23 MED ORDER — GADOBENATE DIMEGLUMINE 529 MG/ML IV SOLN
15.0000 mL | Freq: Once | INTRAVENOUS | Status: AC | PRN
  Administered 2016-12-23: 15 mL via INTRAVENOUS

## 2017-08-05 IMAGING — MG MM DIGITAL SCREENING BILAT W/ TOMO W/ CAD
9 of 12 series · 9 of 28 positions shown · non-contrast
Comparison: Previous exam(s).

CLINICAL DATA: Screening.

EXAM:
2D DIGITAL SCREENING BILATERAL MAMMOGRAM WITH CAD AND ADJUNCT TOMO

[R CC]
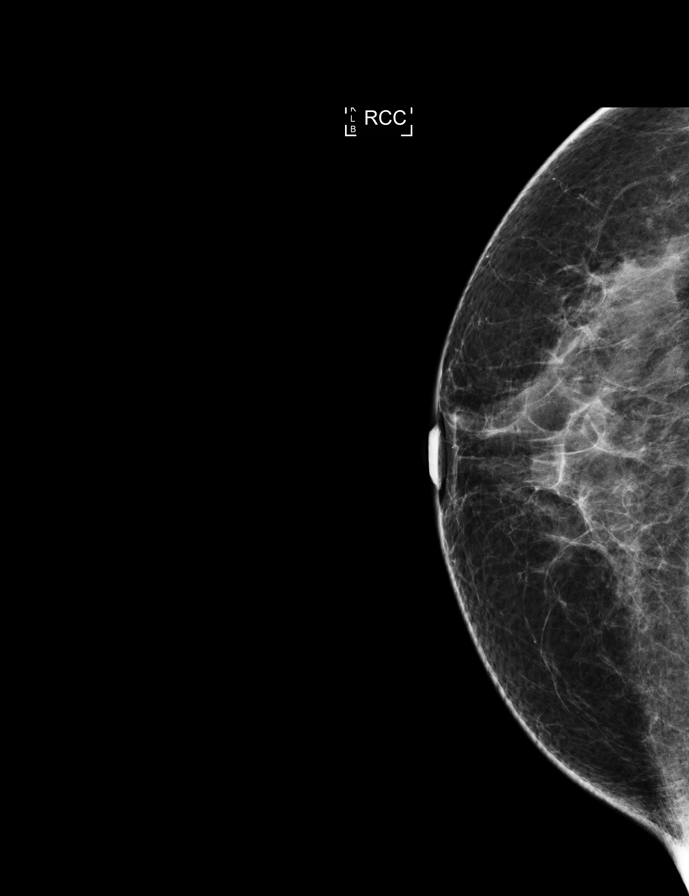

[L MLO synth-2D]
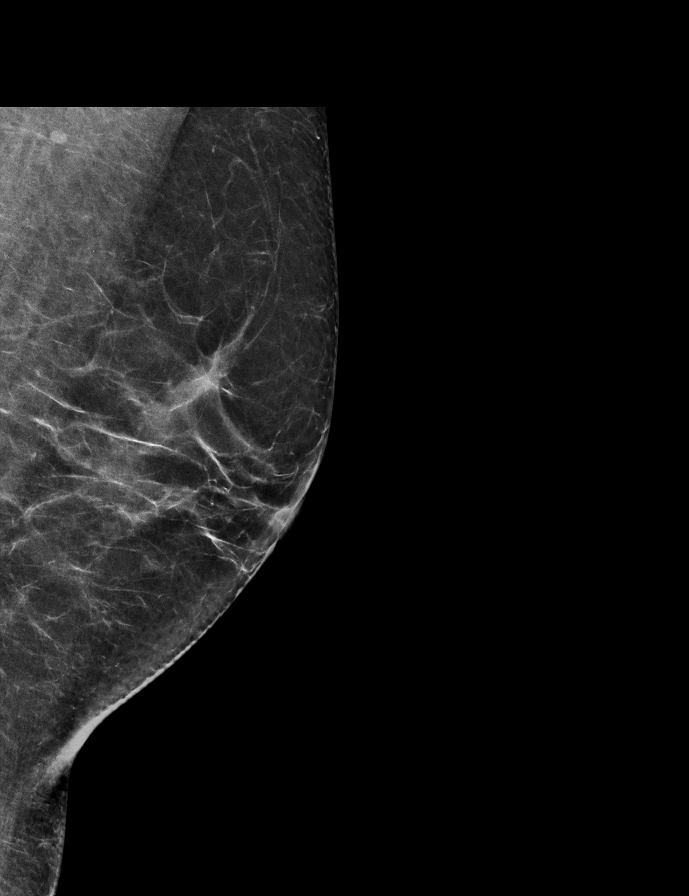

[L CC synth-2D]
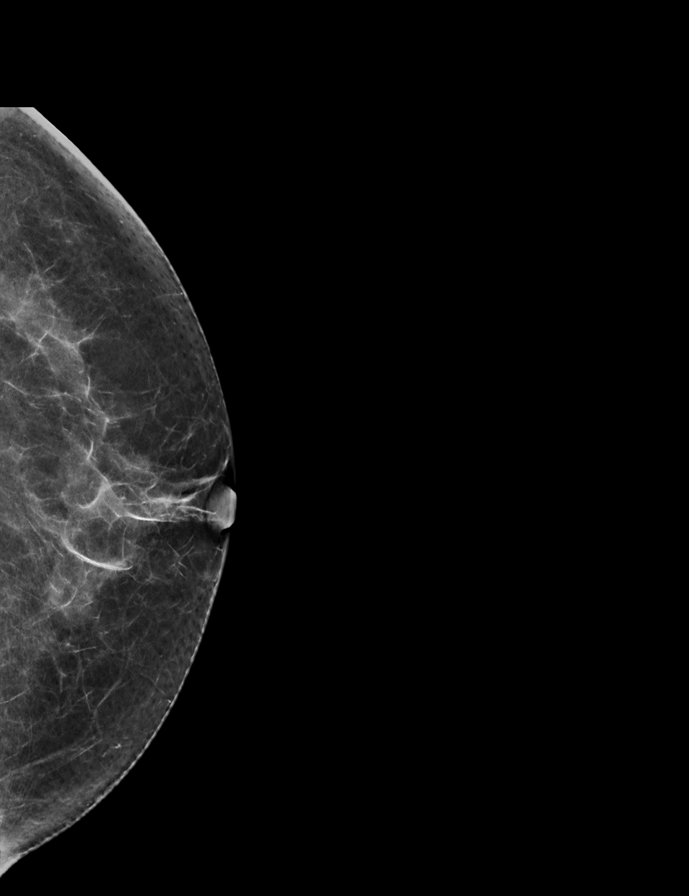

[R MLO]
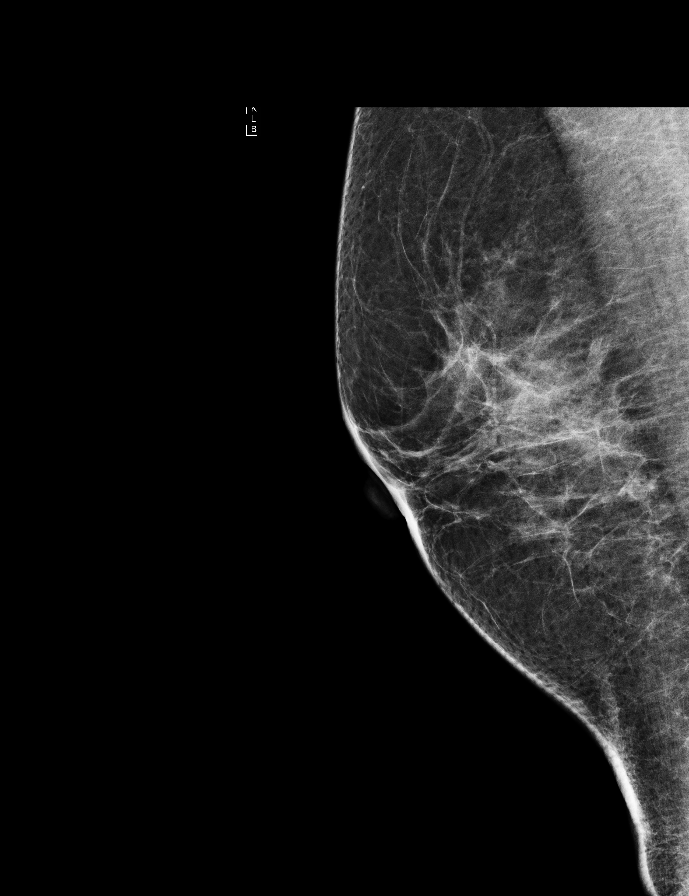

[L MLO]
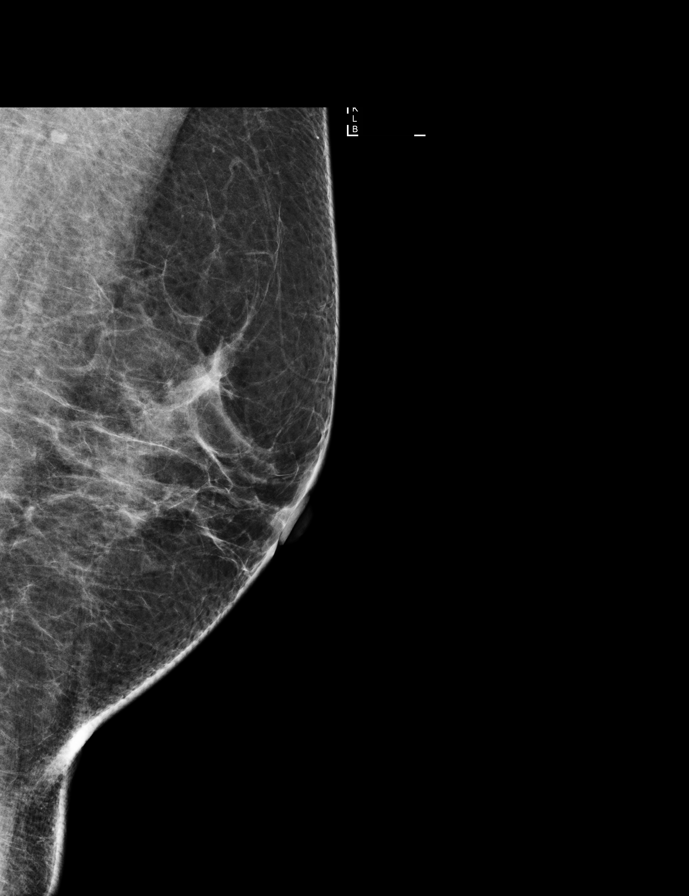

[R MLO synth-2D]
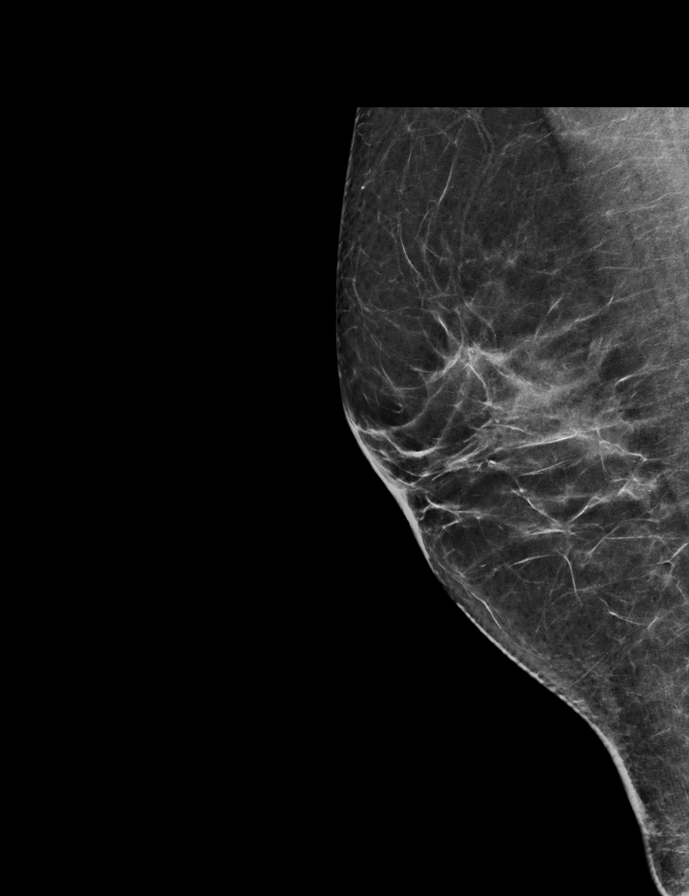

[R CC synth-2D]
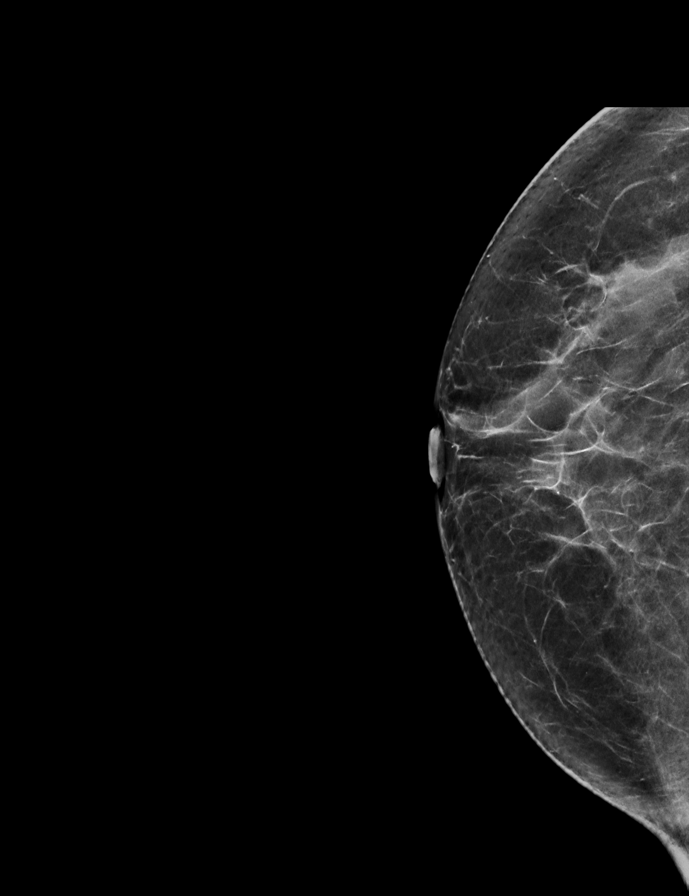

[L CC]
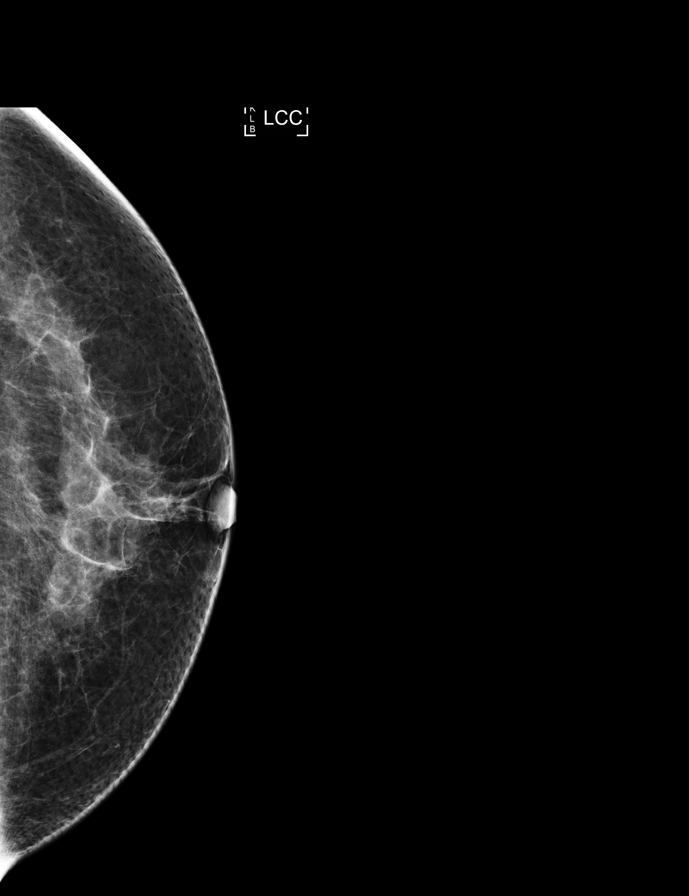

[L CC tomo · tomo slice 32/63.0]
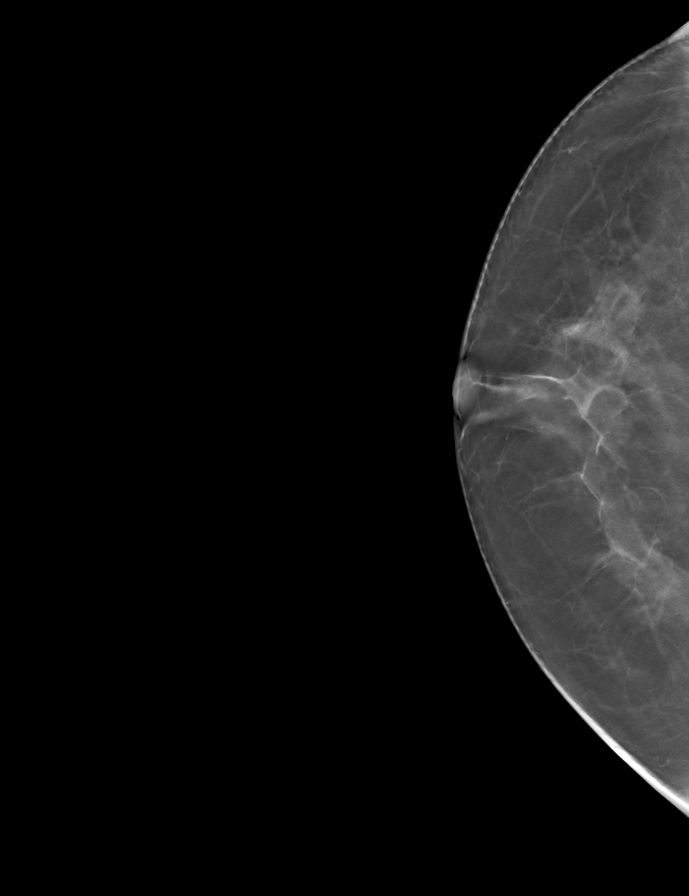

[9 of 28 positions shown; findings below may reference images not displayed]

ACR Breast Density Category b: There are scattered areas of
fibroglandular density.
FINDINGS: There are no findings suspicious for malignancy. Images were
processed with CAD.
IMPRESSION: No mammographic evidence of malignancy. A result letter of this
screening mammogram will be mailed directly to the patient.

RECOMMENDATION:
Screening mammogram in one year. (Code:97-6-RS4)

BI-RADS CATEGORY  1: Negative.

## 2017-12-21 ENCOUNTER — Other Ambulatory Visit: Payer: Self-pay | Admitting: Family Medicine

## 2017-12-21 DIAGNOSIS — Z1231 Encounter for screening mammogram for malignant neoplasm of breast: Secondary | ICD-10-CM

## 2018-01-11 ENCOUNTER — Ambulatory Visit
Admission: RE | Admit: 2018-01-11 | Discharge: 2018-01-11 | Disposition: A | Discharge status: Home / Self Care | Payer: Medicare Other | Source: Ambulatory Visit | Attending: Family Medicine | Admitting: Family Medicine

## 2018-01-11 DIAGNOSIS — Z1231 Encounter for screening mammogram for malignant neoplasm of breast: Secondary | ICD-10-CM | POA: Diagnosis not present

## 2018-09-29 ENCOUNTER — Ambulatory Visit (INDEPENDENT_AMBULATORY_CARE_PROVIDER_SITE_OTHER): Payer: Medicare Other | Admitting: Orthopaedic Surgery

## 2018-09-29 ENCOUNTER — Ambulatory Visit (INDEPENDENT_AMBULATORY_CARE_PROVIDER_SITE_OTHER): Payer: Self-pay

## 2018-09-29 VITALS — BP 122/80 | HR 74 | Temp 98.3°F | Ht 66.0 in | Wt 170.0 lb

## 2018-09-29 DIAGNOSIS — M542 Cervicalgia: Secondary | ICD-10-CM

## 2018-09-29 DIAGNOSIS — M4722 Other spondylosis with radiculopathy, cervical region: Secondary | ICD-10-CM

## 2018-09-29 NOTE — Progress Notes (Signed)
Office Visit Note   Patient: Gina Gould           Date of Birth: 1951/01/01           MRN: [id number] Visit Date: 09/29/2018              Requested by: Marisue Ivan, MD [phone] [facility] [address] [facility] [place], [place] [address] PCP: Marisue Ivan, MD   Assessment & Plan: Visit Diagnoses:  1. Neck pain   2. Other spondylosis with radiculopathy, cervical region     Plan: We will set patient up for some physical therapy recheck her in 5 weeks.  We reviewed the x-rays which showed significant spondylosis C5-6 C6-7 with disc space narrowing endplate spurring and foraminal narrowing.  Follow-up after therapy for recheck.  Follow-Up Instructions: Return in about 5 weeks (around 11/03/2018).   Orders:  Orders Placed This Encounter  Procedures  . XR Cervical Spine 2 or 3 views  . Ambulatory referral to Physical Therapy   No orders of the defined types were placed in this encounter.     Procedures: No procedures performed   Clinical Data: No additional findings.   Subjective: Chief Complaint  Patient presents with  . Neck - Pain    HPI 68 year old female who sisters had a cervical fusion done by me in the past is here with pain between her shoulder blades.  Pain tends to wax and wanes at times is been severe she has trouble sleeping.  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  She is not noticed any numbness or tingling in her hands at times with turning she feels pain that shoots from her neck down to the thoracolumbar junction.  She describes this as sharp.  She has some pain adjacent to the scapula which is dull.  No past history of fracture.  She has had x-rays in the past done at clinic run by [facility] which showed spondylosis in the cervical spine.  She denies any gait disturbance no bowel bladder symptoms.  Review of Systems positive for hyperlipidemia.,  GERD.  Positive for chronic neck pain.  Otherwise 14 point  systems negative is obtains HPI.   Objective: Vital Signs: BP 122/80 (BP Location: Left Arm)   Pulse 74   Temp 98.3 F (36.8 C) (Oral)   Ht 5\' 6"  (1.676 m)   Wt 170 lb (77.1 kg)   BMI 27.44 kg/m   Physical Exam Constitutional:      Appearance: She is well-developed.  HENT:     Head: Normocephalic.     Right Ear: External ear normal.     Left Ear: External ear normal.  Eyes:     Pupils: Pupils are equal, round, and reactive to light.  Neck:     Thyroid: No thyromegaly.     Trachea: No tracheal deviation.  Cardiovascular:     Rate and Rhythm: Normal rate.  Pulmonary:     Effort: Pulmonary effort is normal.  Abdominal:     Palpations: Abdomen is soft.  Skin:    General: Skin is warm and dry.  Neurological:     Mental Status: She is alert and oriented to person, place, and time.  Psychiatric:        Behavior: Behavior normal.     Ortho Exam patient has brachial plexus tenderness both right and left some increased pain with compression some relief with distraction.  Lower extremity reflexes 2+  normal heel toe gait no isolated motor weakness.  Some pain with rotation 75% right and left.  Tenderness along the superior medial border the scapula along the medial aspect of the scapula.  Specialty Comments:  No specialty comments available.  Imaging: No results found.   PMFS History: Patient Active Problem List   Diagnosis Date Noted  . Other spondylosis with radiculopathy, cervical region 10/01/2018  . HYPERLIPIDEMIA 08/30/2010   History reviewed. No pertinent past medical history.  Family History  Problem Relation Age of Onset  . Breast cancer Neg Hx     History reviewed. No pertinent surgical history. Social History   Occupational History  . Not on file  Tobacco Use  . Smoking status: Not on file  Substance and Sexual Activity  . Alcohol use: Not on file  . Drug use: Not on file  . Sexual activity: Not on file

## 2018-10-01 ENCOUNTER — Encounter (INDEPENDENT_AMBULATORY_CARE_PROVIDER_SITE_OTHER): Payer: Self-pay | Admitting: Orthopaedic Surgery

## 2018-10-01 DIAGNOSIS — M4722 Other spondylosis with radiculopathy, cervical region: Secondary | ICD-10-CM | POA: Insufficient documentation

## 2018-10-31 ENCOUNTER — Ambulatory Visit: Payer: Medicare Other | Attending: Orthopaedic Surgery

## 2018-10-31 DIAGNOSIS — M542 Cervicalgia: Secondary | ICD-10-CM | POA: Diagnosis not present

## 2018-10-31 DIAGNOSIS — M62838 Other muscle spasm: Secondary | ICD-10-CM | POA: Diagnosis present
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
## 2018-11-03 ENCOUNTER — Ambulatory Visit (INDEPENDENT_AMBULATORY_CARE_PROVIDER_SITE_OTHER): Payer: Medicare Other | Admitting: Orthopaedic Surgery

## 2018-11-07 ENCOUNTER — Ambulatory Visit: Payer: Medicare Other

## 2018-11-07 DIAGNOSIS — M62838 Other muscle spasm: Secondary | ICD-10-CM

## 2018-11-07 DIAGNOSIS — M542 Cervicalgia: Secondary | ICD-10-CM | POA: Diagnosis not present
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
## 2018-11-14 ENCOUNTER — Ambulatory Visit: Payer: Medicare Other

## 2018-11-14 ENCOUNTER — Other Ambulatory Visit: Payer: Self-pay

## 2018-11-14 DIAGNOSIS — M542 Cervicalgia: Secondary | ICD-10-CM

## 2018-11-14 DIAGNOSIS — M62838 Other muscle spasm: Secondary | ICD-10-CM
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
## 2018-11-21 ENCOUNTER — Telehealth (INDEPENDENT_AMBULATORY_CARE_PROVIDER_SITE_OTHER): Payer: Self-pay | Admitting: Orthopaedic Surgery

## 2018-11-21 ENCOUNTER — Ambulatory Visit: Payer: Medicare Other

## 2018-11-21 NOTE — Telephone Encounter (Signed)
noted 

## 2018-11-21 NOTE — Telephone Encounter (Signed)
She can come as scheduled. She has done some PT and should meet insurance requirements to get MRI if she is still having symptoms. thanks

## 2018-11-21 NOTE — Telephone Encounter (Signed)
New Message  Pt verbalized Physical Therapy canceled her appt out for three weeks.  Pt verbalized wnating to know if she can get her Physical Therapy done somewhere else or if we can accept what's available from Physical Therapy for her appt on 4.3.20 or does she need to reschedule her appt until her Physical Therapy is complete.

## 2018-11-21 NOTE — Telephone Encounter (Signed)
Patient verbalized understanding to keep 4.3.2020 appt and will try to do e-Visit with PT. Either way she will be here on 4.3.2020.

## 2018-11-21 NOTE — Telephone Encounter (Signed)
Please advise 

## 2018-11-30 ENCOUNTER — Telehealth (INDEPENDENT_AMBULATORY_CARE_PROVIDER_SITE_OTHER): Payer: Self-pay | Admitting: Radiology

## 2018-11-30 NOTE — Telephone Encounter (Signed)
I called patient to confirm appointment for 12/01/2018 at 1:30pm. Patient answered "No" to all COVID-19 screening questions.

## 2018-12-01 ENCOUNTER — Encounter (INDEPENDENT_AMBULATORY_CARE_PROVIDER_SITE_OTHER): Payer: Self-pay | Admitting: Orthopaedic Surgery

## 2018-12-01 ENCOUNTER — Ambulatory Visit (INDEPENDENT_AMBULATORY_CARE_PROVIDER_SITE_OTHER): Payer: Medicare Other | Admitting: Orthopaedic Surgery

## 2018-12-01 ENCOUNTER — Other Ambulatory Visit: Payer: Self-pay

## 2018-12-01 VITALS — Ht 66.0 in | Wt 170.0 lb

## 2018-12-01 DIAGNOSIS — M4722 Other spondylosis with radiculopathy, cervical region: Secondary | ICD-10-CM

## 2018-12-01 DIAGNOSIS — M4802 Spinal stenosis, cervical region: Secondary | ICD-10-CM | POA: Diagnosis not present

## 2018-12-01 NOTE — Progress Notes (Signed)
Office Visit Note   Patient: Gina Gould           Date of Birth: Nov 18, 1950           MRN: [id number] Visit Date: 12/01/2018              Requested by: Marisue Ivan, MD [phone] [facility] [address] [facility] [place], [place] [address] PCP: Marisue Ivan, MD   Assessment & Plan: Visit Diagnoses:  1. Foraminal stenosis of cervical region   2. Other spondylosis with radiculopathy, cervical region     Plan: Recheck 2 months.  She can restart therapy once the physical therapy clinic is open back up.  If she has persistent symptoms and we can consider diagnostic MRI imaging of the cervical spine.  Today we again reviewed the plain radiographs which showed significant foraminal stenosis particularly at C5-6 and to lesser degree C6-7.  Follow-Up Instructions: Return in about 2 months (around 01/31/2019).   Orders:  No orders of the defined types were placed in this encounter.  No orders of the defined types were placed in this encounter.     Procedures: No procedures performed   Clinical Data: No additional findings.   Subjective: Chief Complaint  Patient presents with  . Neck - Pain, Follow-up    HPI 69 year old female returns with ongoing problems with neck and left scapular pain.  She went through physical therapy for a period of time in [facility] and then had it stopped due to the coronavirus.  She states she is waiting for them to start therapy back up.  She states the pain is not as extreme and she thinks she maybe got 20% pain relief.  Previous x-ray showed significant spondylosis with narrowing at C5-6 C6-7.  Patient has bowel bladder symptoms no extremity weakness no falling no myelopathic symptoms no numbness or tingling in her hands.  Review of Systems is systems positive for GERD, hyperlipidemia history of chronic neck pain 14 point systems otherwise negative.  Patient not had cervical imaging studies.   Objective: Vital Signs: Ht 5\' 6"   (1.676 m)   Wt 170 lb (77.1 kg)   BMI 27.44 kg/m   Physical Exam Constitutional:      Appearance: She is well-developed.  HENT:     Head: Normocephalic.     Right Ear: External ear normal.     Left Ear: External ear normal.  Eyes:     Pupils: Pupils are equal, round, and reactive to light.  Neck:     Thyroid: No thyromegaly.     Trachea: No tracheal deviation.  Cardiovascular:     Rate and Rhythm: Normal rate.  Pulmonary:     Effort: Pulmonary effort is normal.  Abdominal:     Palpations: Abdomen is soft.  Skin:    General: Skin is warm and dry.  Neurological:     Mental Status: She is alert and oriented to person, place, and time.  Psychiatric:        Behavior: Behavior normal.     Ortho Exam patient has some brachial plexus tenderness on the left mildly positive Spurling on the left no periscapular atrophy no winging of the scapula.  No rotator cuff weakness no impingement of the shoulder.  Station the hand is intact no thenar or hyperthenar atrophy Elbow has full range of motion. Specialty Comments:  No specialty comments available.  Imaging: No results found.   PMFS History: Patient Active Problem List   Diagnosis Date Noted  .  Foraminal stenosis of cervical region 12/01/2018  . Other spondylosis with radiculopathy, cervical region 10/01/2018  . HYPERLIPIDEMIA 08/30/2010   No past medical history on file.  Family History  Problem Relation Age of Onset  . Breast cancer Neg Hx     No past surgical history on file. Social History   Occupational History  . Not on file  Tobacco Use  . Smoking status: Never Smoker  . Smokeless tobacco: Never Used  Substance and Sexual Activity  . Alcohol use: Never    Frequency: Never  . Drug use: Not on file  . Sexual activity: Not on file

## 2018-12-11 NOTE — Therapy (Signed)
[facility]IN [facility] SERVICES [address] [place], [place], [address] Phone: [phone]   Fax:  [phone]  Patient Details  Name: Gina Gould MRN: [id number] Date of Birth: May 17, 1951 Referring Provider:  No ref. provider found  Encounter Date: 12/11/2018  The [facility] outpatient clinics are closed at this time due to the COVID-19 epidemic. The patient was contacted in regards to their therapy services. The patient is in agreement that they are safe and consent to being on hold for therapy services until the [facility] outpatient facilities reopen. At which time, the patient will be contacted to schedule an appointment to resume therapy services.    Myrene Galas, PT DPT 12/11/2018, 3:02 PM   [facility] MAIN [facility] SERVICES [address] [place], [place], [address] Phone: [phone]   Fax:  [phone]

## 2019-12-17 ENCOUNTER — Ambulatory Visit: Payer: Medicare Other | Admitting: Family Medicine

## 2019-12-17 ENCOUNTER — Encounter: Payer: Self-pay | Admitting: Family Medicine

## 2019-12-17 ENCOUNTER — Other Ambulatory Visit: Payer: Self-pay

## 2019-12-17 DIAGNOSIS — M4722 Other spondylosis with radiculopathy, cervical region: Secondary | ICD-10-CM | POA: Diagnosis not present

## 2019-12-17 MED ORDER — GABAPENTIN 100 MG PO CAPS: 200.0000 mg | ORAL_CAPSULE | Freq: Every day | ORAL | 0 refills | Status: DC
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
## 2019-12-17 NOTE — Assessment & Plan Note (Signed)
Significant overall.  Intermittent radicular symptoms and gabapentin given.  Patient would like to try conservative therapy and we discussed over-the-counter medications that I think will be helpful as well.  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  Discussed which activities to do which wants to avoid.  Discussed icing regimen as well.  Follow-up again in 4 to 8 weeks if worsening symptoms I do believe with patient already doing formal physical therapy advanced imaging could be warranted.  Questionable osteopenia noted on the x-rays as well may need bone density as well.

## 2019-12-17 NOTE — Patient Instructions (Addendum)
Exercises Gabapentin 1-2 pills at night Good to see you.  Keep hands in peripheral vision Turmeric 500mg  daily  Tart cherry extract 1200mg  at night Vitamin D 2000 IU daily  See me again in 5-6 weeks

## 2020-01-08 ENCOUNTER — Other Ambulatory Visit: Payer: Self-pay | Admitting: Family Medicine

## 2020-01-08 DIAGNOSIS — Z1231 Encounter for screening mammogram for malignant neoplasm of breast: Secondary | ICD-10-CM

## 2020-01-09 ENCOUNTER — Ambulatory Visit
Admission: RE | Admit: 2020-01-09 | Discharge: 2020-01-09 | Disposition: A | Discharge status: Home / Self Care | Payer: Medicare Other | Source: Ambulatory Visit | Attending: Family Medicine | Admitting: Family Medicine

## 2020-01-09 DIAGNOSIS — Z1231 Encounter for screening mammogram for malignant neoplasm of breast: Secondary | ICD-10-CM | POA: Diagnosis not present

## 2020-01-28 NOTE — Progress Notes (Signed)
[facility] [address] [place] Phone: [phone] Subjective:   I Gina Gould am serving as a Education administrator for Dr. Hulan Saas.  This visit occurred during the SARS-CoV-2 public health emergency.  Safety protocols were in place, including screening questions prior to the visit, additional usage of staff PPE, and extensive cleaning of exam room while observing appropriate contact time as indicated for disinfecting solutions.   I'm seeing this patient by the request  of:  Dion Body, MD  CC: Neck pain follow-up  NAT:[id number]  Gina Gould is a 69 y.o. female coming in with complaint of neck pain. Patient states she is doing ok. Hasn't had any flare ups.  Patient continues to be very active at this time.  No radicular symptoms.  Unable to tolerate the gabapentin.  Patient states that the pain is still there but it does not stop her from activities.       History reviewed. No pertinent past medical history. History reviewed. No pertinent surgical history. Social History   Socioeconomic History  . Marital status: Married    Spouse name: Not on file  . Number of children: Not on file  . Years of education: Not on file  . Highest education level: Not on file  Occupational History  . Not on file  Tobacco Use  . Smoking status: Never Smoker  . Smokeless tobacco: Never Used  Substance and Sexual Activity  . Alcohol use: Never  . Drug use: Not on file  . Sexual activity: Not on file  Other Topics Concern  . Not on file  Social History Narrative  . Not on file   Social Determinants of Health   Financial Resource Strain:   . Difficulty of Paying Living Expenses:   Food Insecurity:   . Worried About Charity fundraiser in the Last Year:   . Arboriculturist in the Last Year:   Transportation Needs:   . Film/video editor (Medical):   Marland Kitchen Lack of Transportation (Non-Medical):   Physical Activity:   . Days of Exercise  per Week:   . Minutes of Exercise per Session:   Stress:   . Feeling of Stress :   Social Connections:   . Frequency of Communication with Friends and Family:   . Frequency of Social Gatherings with Friends and Family:   . Attends Religious Services:   . Active Member of Clubs or Organizations:   . Attends Archivist Meetings:   Marland Kitchen Marital Status:    Allergies  Allergen Reactions  . Meperidine Shortness Of Breath  . Codeine Sulfate     REACTION: palpation, SOB  . Hydrocodone-Acetaminophen     REACTION: palpation and SOB  . Morphine Sulfate     REACTION: palpations   Family History  Problem Relation Age of Onset  . Breast cancer Neg Hx          Current Outpatient Medications (Other):  .  omeprazole (PRILOSEC) 20 MG capsule,    Reviewed prior external information including notes and imaging from  primary care provider As well as notes that were available from care everywhere and other healthcare systems.  Past medical history, social, surgical and family history all reviewed in electronic medical record.  No pertanent information unless stated regarding to the chief complaint.   Review of Systems:  No headache, visual changes, nausea, vomiting, diarrhea, constipation, dizziness, abdominal pain, skin rash, fevers, chills, night sweats, weight loss, swollen  lymph nodes, body aches, joint swelling, chest pain, shortness of breath, mood changes. POSITIVE muscle aches  Objective  Blood pressure 100/70, pulse (!) 59, height 5\' 6"  (1.676 m), weight 175 lb (79.4 kg), SpO2 97 %.   General: No apparent distress alert and oriented x3 mood and affect normal, dressed appropriately.  HEENT: Pupils equal, extraocular movements intact  Respiratory: Patient's speak in full sentences and does not appear short of breath  Cardiovascular: No lower extremity edema, non tender, no erythema  Neuro: Cranial nerves II through XII are intact, neurovascularly intact in all extremities  with 2+ DTRs and 2+ pulses.  Gait normal with good balance and coordination.  MSK:  Non tender with full range of motion and good stability and symmetric strength and tone of shoulders, elbows, wrist, hip, knee and ankles bilaterally.  Neck exam mild loss of lordosis.  Tightness in the parascapular region right greater than left.   Impression and Recommendations:     The above documentation has been reviewed and is accurate and complete , DO       Note: This dictation was prepared with Dragon dictation along with smaller phrase technology. Any transcriptional errors that result from this process are unintentional.

## 2020-01-29 ENCOUNTER — Ambulatory Visit: Payer: Medicare Other | Admitting: Family Medicine

## 2020-01-29 ENCOUNTER — Encounter: Payer: Self-pay | Admitting: Family Medicine

## 2020-01-29 ENCOUNTER — Other Ambulatory Visit: Payer: Self-pay

## 2020-01-29 DIAGNOSIS — M4722 Other spondylosis with radiculopathy, cervical region: Secondary | ICD-10-CM | POA: Diagnosis not present

## 2020-01-29 NOTE — Patient Instructions (Addendum)
Good to see you 60-75 grams daily protein [employer] See me again in 3-4 months

## 2020-01-29 NOTE — Assessment & Plan Note (Signed)
Patient is doing extremely well at this time.  No significant change.  Having difficulty with the gabapentin so we will discontinue.  Patient will continue with her home exercises.  We discussed potential diet changes.  Follow-up again in 3 to 4 months

## 2020-04-30 ENCOUNTER — Ambulatory Visit: Payer: Medicare Other | Admitting: Family Medicine

## 2020-06-03 ENCOUNTER — Ambulatory Visit: Payer: Medicare Other | Admitting: Family Medicine

## 2021-03-10 ENCOUNTER — Other Ambulatory Visit: Payer: Self-pay | Admitting: Family Medicine

## 2021-03-10 DIAGNOSIS — Z1231 Encounter for screening mammogram for malignant neoplasm of breast: Secondary | ICD-10-CM

## 2021-03-26 ENCOUNTER — Ambulatory Visit
Admission: RE | Admit: 2021-03-26 | Discharge: 2021-03-26 | Disposition: A | Discharge status: Home / Self Care | Payer: Medicare Other | Source: Ambulatory Visit | Attending: Family Medicine | Admitting: Family Medicine

## 2021-03-26 ENCOUNTER — Other Ambulatory Visit: Payer: Self-pay

## 2021-03-26 DIAGNOSIS — Z1231 Encounter for screening mammogram for malignant neoplasm of breast: Secondary | ICD-10-CM

## 2022-11-08 ENCOUNTER — Other Ambulatory Visit: Payer: Self-pay | Admitting: Family Medicine

## 2022-11-08 DIAGNOSIS — Z1231 Encounter for screening mammogram for malignant neoplasm of breast: Secondary | ICD-10-CM

## 2022-11-26 ENCOUNTER — Ambulatory Visit
Admission: RE | Admit: 2022-11-26 | Discharge: 2022-11-26 | Disposition: A | Discharge status: Home / Self Care | Payer: Medicare Other | Source: Ambulatory Visit | Attending: Family Medicine | Admitting: Family Medicine

## 2022-11-26 DIAGNOSIS — Z1231 Encounter for screening mammogram for malignant neoplasm of breast: Secondary | ICD-10-CM | POA: Insufficient documentation
